# Patient Record
Sex: Male | Born: 1995 | Race: White | Hispanic: No | Marital: Single | Smoking: Never smoker
Health system: Southern US, Community
[De-identification: ages and names within clinical notes are randomized; demographics above are authoritative.]

## PROBLEM LIST (undated history)

## (undated) HISTORY — PX: SKIN GRAFT: SHX250

---

## 2013-05-31 ENCOUNTER — Emergency Department (HOSPITAL_COMMUNITY)
Admission: EM | Admit: 2013-05-31 | Discharge: 2013-05-31 | Disposition: A | Discharge status: Home / Self Care | Payer: Self-pay | Attending: Emergency Medicine | Admitting: Emergency Medicine

## 2013-05-31 ENCOUNTER — Encounter (HOSPITAL_COMMUNITY): Payer: Self-pay

## 2013-05-31 DIAGNOSIS — K649 Unspecified hemorrhoids: Secondary | ICD-10-CM

## 2013-05-31 DIAGNOSIS — K5900 Constipation, unspecified: Secondary | ICD-10-CM | POA: Insufficient documentation

## 2013-05-31 DIAGNOSIS — K648 Other hemorrhoids: Secondary | ICD-10-CM | POA: Insufficient documentation

## 2013-05-31 LAB — OCCULT BLOOD, POC DEVICE: Fecal Occult Bld: POSITIVE — AB

## 2013-05-31 MED ORDER — DOCUSATE SODIUM 100 MG PO CAPS: 100.0000 mg | ORAL_CAPSULE | Freq: Three times a day (TID) | ORAL | Status: DC | PRN

## 2013-05-31 MED ORDER — HYDROCORTISONE 2.5 % RE CREA: TOPICAL_CREAM | RECTAL | Status: DC

## 2013-05-31 NOTE — ED Notes (Signed)
Pt c/o rectal pain for past few days.  Denies any bleeding or injury.  Reports feels like swelling.  Denies history of hemorrhoids.

## 2013-05-31 NOTE — ED Provider Notes (Signed)
History     CSN: [id number]  Arrival date & time 05/31/13  0900   First MD Initiated Contact with Patient 05/31/13 [phone]      Chief Complaint  Patient presents with  . Rectal Pain    (Consider location/radiation/quality/duration/timing/severity/associated sxs/prior treatment) HPI Comments: Jonathan Weaver is a 17 y.o. male who presents to the Emergency Department complaining of pain and swelling ot his rectum for several days..  He states that he has hard, dry stools and has been straining recently to have a bowel movement.  He denies abdominal pain, bloating, fever, or vomiting.  He also denies back pain or blood in his stool.  He has not tried any therapies at home prior to ED arrival.     The history is provided by the patient.    History reviewed. No pertinent past medical history.  History reviewed. No pertinent past surgical history.  No family history on file.  History  Substance Use Topics  . Smoking status: Never Smoker   . Smokeless tobacco: Not on file  . Alcohol Use: No      Review of Systems  Constitutional: Negative for fever, chills, activity change and appetite change.  Respiratory: Negative for chest tightness and shortness of breath.   Cardiovascular: Negative for chest pain.  Gastrointestinal: Positive for constipation and rectal pain. Negative for nausea, vomiting, abdominal pain, diarrhea, blood in stool, abdominal distention and anal bleeding.  Genitourinary: Negative for dysuria, hematuria, flank pain, decreased urine volume, discharge, penile swelling, scrotal swelling, difficulty urinating, penile pain and testicular pain.  Musculoskeletal: Negative for back pain and arthralgias.  Neurological: Negative for dizziness, syncope, weakness, light-headedness and numbness.  Psychiatric/Behavioral: Negative for confusion.  All other systems reviewed and are negative.    Allergies  Review of patient's allergies indicates no known allergies.  Home  Medications   Current Outpatient Rx  Name  Route  Sig  Dispense  Refill  . aspirin 325 MG tablet   Oral   Take 325 mg by mouth every 6 (six) hours as needed for pain.           BP 130/79  Pulse 88  Temp(Src) 99.1 F (37.3 C) (Oral)  Resp 18  Ht 5\' 10"  (1.778 m)  Wt 183 lb (83.008 kg)  BMI 26.26 kg/m2  SpO2 98%  Physical Exam  Nursing note and vitals reviewed. Constitutional: He is oriented to person, place, and time. He appears well-developed and well-nourished. No distress.  HENT:  Head: Normocephalic and atraumatic.  Mouth/Throat: Oropharynx is clear and moist.  Cardiovascular: Normal rate, regular rhythm, normal heart sounds and intact distal pulses.   No murmur heard. Pulmonary/Chest: Effort normal and breath sounds normal. No respiratory distress.  Abdominal: Soft. He exhibits no distension and no mass. There is no tenderness. There is no rebound and no guarding.  Genitourinary: Prostate normal. Rectal exam shows internal hemorrhoid and tenderness. Rectal exam shows no external hemorrhoid, no fissure and anal tone normal. Guaiac positive stool.  Brown heme positive stool.  Palpable internal hemorrhoid just inside the anus.  No other rectal masses.  Prostate is NT.    Musculoskeletal: Normal range of motion. He exhibits no edema and no tenderness.  Neurological: He is alert and oriented to person, place, and time. He exhibits normal muscle tone. Coordination normal.  Skin: Skin is warm and dry.    ED Course  Procedures (including critical care time)  Labs Reviewed  OCCULT BLOOD, POC DEVICE - Abnormal; Notable for the following:  Fecal Occult Bld POSITIVE (*)    All other components within normal limits        MDM   Pt is well appearing, lying on the stretcher, NAD, texting on his phone.  Orthostatic vitals are wnml.  Small palpable internal hemorrhoid w/o visible thrombus or frank bleeding.  Abdomen is soft, NT on exam.  No perirectal abscess seen.    Pt  agrees to sitz baths, anusol HC cream and stool softener.  Advised him to drink water and increase fiber.  He appear stable for d/c and agrees to return here if sx's worsen.         Shann Merrick L. Trisha Mangle, PA-C 06/01/13 2148

## 2013-06-02 NOTE — ED Provider Notes (Signed)
Medical screening examination/treatment/procedure(s) were performed by non-physician practitioner and as supervising physician I was immediately available for consultation/collaboration.   Caira Poche, MD 06/02/13 0715 

## 2013-06-05 ENCOUNTER — Emergency Department (HOSPITAL_COMMUNITY)
Admission: EM | Admit: 2013-06-05 | Discharge: 2013-06-05 | Disposition: A | Discharge status: Home / Self Care | Payer: Self-pay | Attending: Emergency Medicine | Admitting: Emergency Medicine

## 2013-06-05 ENCOUNTER — Emergency Department (HOSPITAL_COMMUNITY): Payer: Self-pay

## 2013-06-05 ENCOUNTER — Encounter (HOSPITAL_COMMUNITY): Payer: Self-pay

## 2013-06-05 DIAGNOSIS — K610 Anal abscess: Secondary | ICD-10-CM

## 2013-06-05 DIAGNOSIS — R509 Fever, unspecified: Secondary | ICD-10-CM | POA: Insufficient documentation

## 2013-06-05 DIAGNOSIS — R300 Dysuria: Secondary | ICD-10-CM | POA: Insufficient documentation

## 2013-06-05 DIAGNOSIS — M549 Dorsalgia, unspecified: Secondary | ICD-10-CM | POA: Insufficient documentation

## 2013-06-05 DIAGNOSIS — K612 Anorectal abscess: Secondary | ICD-10-CM | POA: Insufficient documentation

## 2013-06-05 DIAGNOSIS — Z79899 Other long term (current) drug therapy: Secondary | ICD-10-CM | POA: Insufficient documentation

## 2013-06-05 LAB — CBC WITH DIFFERENTIAL/PLATELET
Basophils Absolute: 0 10*3/uL (ref 0.0–0.1)
Basophils Relative: 0 % (ref 0–1)
Eosinophils Relative: 1 % (ref 0–5)
HCT: 41.8 % (ref 39.0–52.0)
MCHC: 33.5 g/dL (ref 30.0–36.0)
MCV: 86.7 fL (ref 78.0–100.0)
Monocytes Absolute: 1.3 10*3/uL — ABNORMAL HIGH (ref 0.1–1.0)
Neutro Abs: 9.7 10*3/uL — ABNORMAL HIGH (ref 1.7–7.7)
RDW: 13.1 % (ref 11.5–15.5)

## 2013-06-05 LAB — BASIC METABOLIC PANEL
BUN: 22 mg/dL (ref 6–23)
Creatinine, Ser: 1.01 mg/dL (ref 0.50–1.35)
GFR calc Af Amer: >90 mL/min (ref >90)
GFR calc non Af Amer: >90 mL/min (ref >90)

## 2013-06-05 MED ORDER — SODIUM CHLORIDE 0.9 % IV SOLN
Freq: Once | INTRAVENOUS | Status: AC
  Administered 2013-06-05: 13:00:00 via INTRAVENOUS

## 2013-06-05 MED ORDER — METRONIDAZOLE 500 MG PO TABS: 500.0000 mg | ORAL_TABLET | Freq: Two times a day (BID) | ORAL | Status: DC

## 2013-06-05 MED ORDER — IOHEXOL 300 MG/ML  SOLN
50.0000 mL | Freq: Once | INTRAMUSCULAR | Status: AC | PRN
  Administered 2013-06-05: 50 mL via ORAL

## 2013-06-05 MED ORDER — CIPROFLOXACIN HCL 500 MG PO TABS: 500.0000 mg | ORAL_TABLET | Freq: Two times a day (BID) | ORAL | Status: DC

## 2013-06-05 MED ORDER — SODIUM CHLORIDE 0.9 % IV BOLUS (SEPSIS)
1000.0000 mL | Freq: Once | INTRAVENOUS | Status: AC
  Administered 2013-06-05: 1000 mL via INTRAVENOUS

## 2013-06-05 MED ORDER — ONDANSETRON HCL 4 MG/2ML IJ SOLN
4.0000 mg | Freq: Once | INTRAMUSCULAR | Status: AC
  Administered 2013-06-05: 4 mg via INTRAVENOUS
  Filled 2013-06-05: qty 2

## 2013-06-05 MED ORDER — HYDROMORPHONE HCL PF 1 MG/ML IJ SOLN
1.0000 mg | Freq: Once | INTRAMUSCULAR | Status: AC
  Administered 2013-06-05: 1 mg via INTRAVENOUS
  Filled 2013-06-05: qty 1

## 2013-06-05 MED ORDER — SODIUM CHLORIDE 0.9 % IV SOLN
3.0000 g | Freq: Once | INTRAVENOUS | Status: AC
  Administered 2013-06-05: 3 g via INTRAVENOUS
  Filled 2013-06-05: qty 3

## 2013-06-05 MED ORDER — IOHEXOL 300 MG/ML  SOLN
100.0000 mL | Freq: Once | INTRAMUSCULAR | Status: AC | PRN
  Administered 2013-06-05: 100 mL via INTRAVENOUS

## 2013-06-05 MED ORDER — ONDANSETRON HCL 8 MG PO TABS: 8.0000 mg | ORAL_TABLET | Freq: Three times a day (TID) | ORAL | Status: DC | PRN

## 2013-06-05 MED ORDER — OXYCODONE-ACETAMINOPHEN 5-325 MG PO TABS: 1.0000 | ORAL_TABLET | ORAL | Status: DC | PRN

## 2013-06-05 NOTE — ED Notes (Signed)
States that he was diagnosed with hemorrhoids about 4 days ago, states that the pain has gotten worse and is seeing bloody discharge from his rectum.

## 2013-06-05 NOTE — ED Notes (Signed)
Pt reports was diagnosed with hemorrhoids 4 days ago.  Has been using otc cream without relief.

## 2013-06-05 NOTE — ED Provider Notes (Signed)
History  This chart was scribed for Joya Gaskins, MD by Greggory Stallion, ED Scribe. This patient was seen in room [facility]17/[facility] and the patient's care was started at 12:38 PM.   CSN: [id number]  Arrival date & time 06/05/13  1203    Chief Complaint  Patient presents with  . Hemorrhoids    The history is provided by the patient. No language interpreter was used.    HPI Comments: Jonathan Weaver is a 17 y.o. male who presents to the Emergency Department complaining of hemorrhoids that started 6 days ago with associated fevers and back pain. Pt states he can pass bowel movements but they are with pain. He states he has dysuria if he forces it out. Pt denies neck pain, sore throat, visual disturbance, CP, cough, SOB, abdominal pain, nausea, emesis, diarrhea, HA, weakness, numbness and rash as associated symptoms.   Nothing improves his symptoms His course is worsening  PMH - none    History  Substance Use Topics  . Smoking status: Never Smoker   . Smokeless tobacco: Not on file  . Alcohol Use: No      Review of Systems  Constitutional: Positive for fever.  HENT: Negative for neck pain.   Eyes: Negative for visual disturbance.  Respiratory: Negative for cough and shortness of breath.   Cardiovascular: Negative for chest pain.  Gastrointestinal: Negative for abdominal pain.  Genitourinary: Positive for dysuria.  Musculoskeletal: Positive for back pain.  All other systems reviewed and are negative.    Allergies  Review of patient's allergies indicates no known allergies.  Home Medications   Current Outpatient Rx  Name  Route  Sig  Dispense  Refill  . aspirin 325 MG tablet   Oral   Take 325 mg by mouth every 6 (six) hours as needed for pain.         Marland Kitchen docusate sodium (COLACE) 100 MG capsule   Oral   Take 1 capsule (100 mg total) by mouth 3 (three) times daily as needed for constipation.   60 capsule   0   . hydrocortisone (ANUSOL-HC) 2.5 % rectal cream     Apply rectally 2 times daily   30 g   0     BP 121/78  Pulse 101  Temp(Src) 100.2 F (37.9 C) (Oral)  Resp 16  Ht 5\' 9"  (1.753 m)  Wt 183 lb (83.008 kg)  BMI 27.01 kg/m2  SpO2 98% BP 129/64  Pulse 69  Temp(Src) 100.2 F (37.9 C) (Oral)  Resp 18  Ht 5\' 9"  (1.753 m)  Wt 183 lb (83.008 kg)  BMI 27.01 kg/m2  SpO2 99%   Physical Exam  CONSTITUTIONAL: Well developed/well nourished, uncomfortable appearing HEAD: Normocephalic/atraumatic EYES: EOMI/PERRL ENMT: Mucous membranes moist NECK: supple no meningeal signs SPINE:entire spine nontender CV: S1/S2 noted, no murmurs/rubs/gallops noted LUNGS: Lungs are clear to auscultation bilaterally, no apparent distress ABDOMEN: soft, nontender, no rebound or guarding Rectal - pus noted from rectum.  No external abscess noted.  He is tender on digital exam.  He is diffusely tender in perirectal region and has firmness in the perineum GU:no cva tenderness NEURO: Pt is awake/alert, moves all extremitiesx4 EXTREMITIES: pulses normal, full ROM SKIN: warm, color normal PSYCH: no abnormalities of mood noted  ED Course  Procedures   DIAGNOSTIC STUDIES: Oxygen Saturation is 98% on RA, normal by my interpretation.    COORDINATION OF CARE: 12:43 PM-Discussed treatment plan with pt at bedside and pt agreed to plan.  Concern for  perirectal abcess, CT imaging ordered  3:10 PM Per CT, pt has perianal abscess I spoke to mark jenkins with surgery I informed him that patient is already draining from abscess and he is nontoxic and not septic Pt can be discharged on oral cipro/flagyll and f/u with outpatient clinic dr ziegler on Tuesday Pt is agreeable with this plan and is comfortable with plan He will return if pain worsens, any new abdominal pain or unable to tolerate the meds   MDM  Nursing notes including past medical history and social history reviewed and considered in documentation Labs/vital reviewed and considered Previous  records reviewed and considered - recent ED visit reviewed       I personally performed the services described in this documentation, which was scribed in my presence. The recorded information has been reviewed and is accurate.     Joya Gaskins, MD 06/05/13 [phone]

## 2013-10-13 ENCOUNTER — Encounter (HOSPITAL_COMMUNITY): Payer: Self-pay | Admitting: Emergency Medicine

## 2013-10-13 ENCOUNTER — Emergency Department (HOSPITAL_COMMUNITY)
Admission: EM | Admit: 2013-10-13 | Discharge: 2013-10-13 | Disposition: A | Discharge status: Home / Self Care | Payer: Self-pay | Attending: Emergency Medicine | Admitting: Emergency Medicine

## 2013-10-13 DIAGNOSIS — J029 Acute pharyngitis, unspecified: Secondary | ICD-10-CM

## 2013-10-13 DIAGNOSIS — R5381 Other malaise: Secondary | ICD-10-CM | POA: Insufficient documentation

## 2013-10-13 MED ORDER — AMOXICILLIN 500 MG PO CAPS: 500.0000 mg | ORAL_CAPSULE | Freq: Three times a day (TID) | ORAL | Status: AC

## 2013-10-13 MED ORDER — AMOXICILLIN 250 MG PO CAPS
500.0000 mg | ORAL_CAPSULE | Freq: Once | ORAL | Status: AC
  Administered 2013-10-13: 500 mg via ORAL

## 2013-10-13 MED ORDER — ONDANSETRON 8 MG PO TBDP
ORAL_TABLET | ORAL | Status: AC
  Filled 2013-10-13: qty 1

## 2013-10-13 MED ORDER — IBUPROFEN 800 MG PO TABS
800.0000 mg | ORAL_TABLET | Freq: Once | ORAL | Status: AC
  Administered 2013-10-13: 800 mg via ORAL
  Filled 2013-10-13: qty 1

## 2013-10-13 MED ORDER — MAGIC MOUTHWASH W/LIDOCAINE: 10.0000 mL | Freq: Four times a day (QID) | ORAL | Status: DC | PRN

## 2013-10-13 MED ORDER — ONDANSETRON 8 MG PO TBDP
8.0000 mg | ORAL_TABLET | Freq: Once | ORAL | Status: AC
  Administered 2013-10-13: 8 mg via ORAL

## 2013-10-13 MED ORDER — PROMETHAZINE HCL 25 MG PO TABS: 25.0000 mg | ORAL_TABLET | Freq: Four times a day (QID) | ORAL | Status: DC | PRN

## 2013-10-13 MED ORDER — ACETAMINOPHEN 500 MG PO TABS
ORAL_TABLET | ORAL | Status: AC
  Administered 2013-10-13: 1000 mg
  Filled 2013-10-13: qty 2

## 2013-10-13 MED ORDER — AMOXICILLIN 250 MG PO CAPS
ORAL_CAPSULE | ORAL | Status: AC
  Filled 2013-10-13: qty 2

## 2013-10-13 NOTE — ED Notes (Signed)
Pt has been able to drink 2 cups of water and is drinking a sprite.

## 2013-10-13 NOTE — ED Provider Notes (Signed)
Medical screening examination/treatment/procedure(s) were performed by non-physician practitioner and as supervising physician I was immediately available for consultation/collaboration.  EKG Interpretation   None        Raeford Razor, MD 10/13/13 2357

## 2013-10-13 NOTE — ED Notes (Addendum)
Headache, fever, sore throat,  Vomited this am.  Throat red with exudate

## 2013-10-13 NOTE — ED Provider Notes (Signed)
CSN: [id number]     Arrival date & time 10/13/13  1515 History   First MD Initiated Contact with Patient 10/13/13 1606     Chief Complaint  Patient presents with  . Sore Throat   (Consider location/radiation/quality/duration/timing/severity/associated sxs/prior Treatment) HPI Comments: Jonathan Weaver is a 17 y.o. Male presenting with a 2 day history of fever with intermittent chills,  sore throat with swollen tonsils, generally body aches, headache and fatigue.  He has also had 2 episodes of emesis, last occuring yesterday, but has been able to tolerate some PO fluids. Symptoms due to not include shortness of breath, chest pain, abdominal pain or diarrhea.  The patient has taken aspirin yesterday for fever reduction with no significant improvement in symptoms.        The history is provided by the patient.    History reviewed. No pertinent past medical history. History reviewed. No pertinent past surgical history. History reviewed. No pertinent family history. History  Substance Use Topics  . Smoking status: Never Smoker   . Smokeless tobacco: Not on file  . Alcohol Use: No    Review of Systems  Constitutional: Positive for fever, chills and fatigue.  HENT: Positive for sore throat. Negative for congestion, ear pain, facial swelling, mouth sores, rhinorrhea, sinus pressure, trouble swallowing and voice change.   Eyes: Negative for discharge.  Respiratory: Negative for cough, shortness of breath, wheezing and stridor.   Cardiovascular: Negative for chest pain.  Gastrointestinal: Positive for nausea and vomiting. Negative for abdominal pain.  Genitourinary: Negative.     Allergies  Review of patient's allergies indicates no known allergies.  Home Medications   Current Outpatient Rx  Name  Route  Sig  Dispense  Refill  . ibuprofen (ADVIL,MOTRIN) 800 MG tablet   Oral   Take 800 mg by mouth once.         . Alum & Mag Hydroxide-Simeth (MAGIC MOUTHWASH W/LIDOCAINE) SOLN  Oral   Take 10 mLs by mouth 4 (four) times daily as needed (throat pain).   120 mL   0     Dispense equal parts   . amoxicillin (AMOXIL) 500 MG capsule   Oral   Take 1 capsule (500 mg total) by mouth 3 (three) times daily.   30 capsule   0   . promethazine (PHENERGAN) 25 MG tablet   Oral   Take 1 tablet (25 mg total) by mouth every 6 (six) hours as needed for nausea.   15 tablet   0    BP 135/72  Pulse 94  Temp(Src) 99.8 F (37.7 C) (Oral)  Resp 14  Ht 5\' 9"  (1.753 m)  Wt 188 lb 7 oz (85.475 kg)  BMI 27.81 kg/m2  SpO2 99% Physical Exam  Constitutional: He is oriented to person, place, and time. He appears well-developed and well-nourished.  HENT:  Head: Normocephalic and atraumatic.  Right Ear: Tympanic membrane and ear canal normal.  Left Ear: Tympanic membrane and ear canal normal.  Nose: No mucosal edema or rhinorrhea.  Mouth/Throat: Uvula is midline and mucous membranes are normal. No trismus in the jaw. No uvula swelling. Oropharyngeal exudate and posterior oropharyngeal erythema present. No posterior oropharyngeal edema or tonsillar abscesses.  Eyes: Conjunctivae are normal.  Cardiovascular: Normal rate, normal heart sounds and normal pulses.   Pulmonary/Chest: Effort normal. No stridor. No respiratory distress. He has no decreased breath sounds. He has no wheezes. He has no rhonchi. He has no rales.  Abdominal: Soft. There is no tenderness.  Musculoskeletal: Normal range of motion.  Lymphadenopathy:       Head (right side): Tonsillar adenopathy present.       Head (left side): Tonsillar adenopathy present.    He has cervical adenopathy.  Neurological: He is alert and oriented to person, place, and time.  Skin: Skin is warm and dry. No rash noted.  Psychiatric: He has a normal mood and affect.    ED Course  Procedures (including critical care time) Labs Review Labs Reviewed - No data to display Imaging Review No results found.  EKG Interpretation    None       MDM   1. Pharyngitis, acute    Pt treated for strep throat with amoxil,  Classic sx without other uri sx.  Prescribed amoxil, first dose given here.  He was also prescribed magic mouthwash,  Phenergan.  Encouraged rest,  Increased fluid intake,  Recheck here for any worsened sx.  His fever responded while here with dose of tylenol and ibuprofen.  He tolerated PO intake prior to dc home,  Reporting feels better now that fever is improved.  The patient appears reasonably screened and/or stabilized for discharge and I doubt any other medical condition or other [facility] requiring further screening, evaluation, or treatment in the ED at this time prior to discharge.     Burgess Amor, PA-C 10/13/13 1725

## 2015-05-15 ENCOUNTER — Emergency Department (HOSPITAL_COMMUNITY)
Admission: EM | Admit: 2015-05-15 | Discharge: 2015-05-15 | Disposition: A | Discharge status: Home / Self Care | Payer: Self-pay | Attending: Emergency Medicine | Admitting: Emergency Medicine

## 2015-05-15 ENCOUNTER — Encounter (HOSPITAL_COMMUNITY): Payer: Self-pay | Admitting: Emergency Medicine

## 2015-05-15 DIAGNOSIS — R110 Nausea: Secondary | ICD-10-CM | POA: Insufficient documentation

## 2015-05-15 DIAGNOSIS — J039 Acute tonsillitis, unspecified: Secondary | ICD-10-CM | POA: Insufficient documentation

## 2015-05-15 LAB — RAPID STREP SCREEN (MED CTR MEBANE ONLY): STREPTOCOCCUS, GROUP A SCREEN (DIRECT): NEGATIVE

## 2015-05-15 MED ORDER — MAGIC MOUTHWASH W/LIDOCAINE: 5.0000 mL | Freq: Three times a day (TID) | ORAL | Status: DC | PRN

## 2015-05-15 MED ORDER — AMOXICILLIN 500 MG PO CAPS: 500.0000 mg | ORAL_CAPSULE | Freq: Three times a day (TID) | ORAL | Status: DC

## 2015-05-15 MED ORDER — ONDANSETRON 8 MG PO TBDP
8.0000 mg | ORAL_TABLET | Freq: Once | ORAL | Status: AC
  Administered 2015-05-15: 8 mg via ORAL
  Filled 2015-05-15: qty 1

## 2015-05-15 MED ORDER — IBUPROFEN 800 MG PO TABS: 800.0000 mg | ORAL_TABLET | Freq: Three times a day (TID) | ORAL | Status: DC

## 2015-05-15 MED ORDER — IBUPROFEN 800 MG PO TABS
800.0000 mg | ORAL_TABLET | Freq: Once | ORAL | Status: AC
  Administered 2015-05-15: 800 mg via ORAL
  Filled 2015-05-15: qty 1

## 2015-05-15 NOTE — ED Provider Notes (Signed)
CSN: [id number]     Arrival date & time 05/15/15  1537 History  This chart was scribed for non-physician practitioner, Pauline Ausammy Kyndall Amero, PA-C working with Gilda Creasehristopher J Pollina, MD found by Gwenyth Oberatherine Macek, ED scribe. This patient was seen in room APFT21/APFT21 and the patient's care was started at 4:30 PM   Chief Complaint  Patient presents with  . Sore Throat  . Nausea   The history is provided by the patient. No language interpreter was used.    HPI Comments: Jonathan Weaver is a 19 y.o. male who presents to the Emergency Department complaining of constant, moderate sore throat, left side worse than right, that started 2 days ago. Pt states fever, nausea,  loose stools, and cough as associated symptoms. He has not tried any treatment PTA. Pt has been unable to tolerate food intake due to level of throat pain. He denies ear pain, neck pain or stiffness as an associated symptom.  History reviewed. No pertinent past medical history. History reviewed. No pertinent past surgical history. History reviewed. No pertinent family history. History  Substance Use Topics  . Smoking status: Never Smoker   . Smokeless tobacco: Not on file  . Alcohol Use: No    Review of Systems  Constitutional: Positive for fever and appetite change. Negative for chills.  HENT: Positive for sore throat and trouble swallowing. Negative for ear pain.   Respiratory: Positive for cough. Negative for chest tightness.   Gastrointestinal: Positive for nausea. Negative for vomiting, abdominal pain, diarrhea and constipation.  Genitourinary: Negative for dysuria.  Musculoskeletal: Positive for myalgias. Negative for arthralgias.  Skin: Negative for rash.  All other systems reviewed and are negative.     Allergies  Review of patient's allergies indicates no known allergies.  Home Medications   Prior to Admission medications   Medication Sig Start Date End Date Taking? Authorizing Provider  Alum & Mag Hydroxide-Simeth  (MAGIC MOUTHWASH W/LIDOCAINE) SOLN Take 10 mLs by mouth 4 (four) times daily as needed (throat pain). 10/13/13   Burgess AmorJulie Idol, PA-C  ibuprofen (ADVIL,MOTRIN) 800 MG tablet Take 800 mg by mouth once.    Historical Provider, MD  promethazine (PHENERGAN) 25 MG tablet Take 1 tablet (25 mg total) by mouth every 6 (six) hours as needed for nausea. 10/13/13   Burgess AmorJulie Idol, PA-C   BP 136/77 mmHg  Pulse 108  Temp(Src) 100 F (37.8 C) (Oral)  Resp 20  Ht 5\' 11"  (1.803 m)  Wt 213 lb 7 oz (96.815 kg)  BMI 29.78 kg/m2  SpO2 99% Physical Exam  Constitutional: He is oriented to person, place, and time. He appears well-developed and well-nourished. No distress.  HENT:  Head: Normocephalic and atraumatic.  Erythema and edema of bilateral tonsils, exudate present bilaterally; uvula midline.  No peritonsillar abscess  Eyes: Conjunctivae and EOM are normal.  Neck: Normal range of motion. Neck supple. No tracheal deviation present.  Cardiovascular: Normal rate, regular rhythm and normal heart sounds.   No murmur heard. Pulmonary/Chest: Effort normal. No respiratory distress.  Abdominal: Soft. He exhibits no distension. There is no tenderness. There is no rebound and no guarding.  Musculoskeletal: Normal range of motion.  Lymphadenopathy:    He has cervical adenopathy (Left-sided superficial cervical lymphadenopathy ).  Neurological: He is alert and oriented to person, place, and time. Coordination normal.  Skin: Skin is warm and dry.  Psychiatric: He has a normal mood and affect. His behavior is normal.  Nursing note and vitals reviewed.   ED Course  Procedures  DIAGNOSTIC STUDIES: Oxygen Saturation is 99% on RA, normal by my interpretation.    COORDINATION OF CARE: 4:35 PM Discussed treatment plan with pt which includes strep test. Pt agreed to plan.   Labs Review Labs Reviewed  RAPID STREP SCREEN  CULTURE, GROUP A STREP    Imaging Review No results found.   EKG Interpretation None       MDM  Pt texting during my assessment and had to be asked to put away phone during exam.  Final diagnoses:  Tonsillitis   Pt is non-toxic.  Airway patent.  Agrees to close PMD f/u or to return here if needed.  No concerning sx's for tonsillar abscess  I personally performed the services described in this documentation, which was scribed in my presence. The recorded information has been reviewed and is accurate.'   Pauline Aus, PA-C 05/17/15 1347  Gilda Crease, MD 05/18/15 [phone]

## 2015-05-15 NOTE — Discharge Instructions (Signed)
Tonsillitis °Tonsillitis is an infection of the throat. This infection causes the tonsils to become red, tender, and puffy (swollen). Tonsils are groups of tissue at the back of your throat. If bacteria caused your infection, antibiotic medicine will be given to you. Sometimes symptoms of tonsillitis can be relieved with the use of steroid medicine. If your tonsillitis is severe and happens often, you may need to get your tonsils removed (tonsillectomy). °HOME CARE  °· Rest and sleep often. °· Drink enough fluids to keep your pee (urine) clear or pale yellow. °· While your throat is sore, eat soft or liquid foods like: °¨ Soup. °¨ Ice cream. °¨ Instant breakfast drinks. °· Eat frozen ice pops. °· Gargle with a warm or cold liquid to help soothe the throat. Gargle with a water and salt mix. Mix 1/4 teaspoon of salt and 1/4 teaspoon of baking soda in 1 cup of water. °· Only take medicines as told by your doctor. °· If you are given medicines (antibiotics), take them as told. Finish them even if you start to feel better. °GET HELP IF: °· You have large, tender lumps in your neck. °· You have a rash. °· You cough up green, yellow-brown, or bloody fluid. °· You cannot swallow liquids or food for 24 hours. °· You notice that only one of your tonsils is swollen. °GET HELP RIGHT AWAY IF:  °· You throw up (vomit). °· You have a very bad headache. °· You have a stiff neck. °· You have chest pain. °· You have trouble breathing or swallowing. °· You have bad throat pain, drooling, or your voice changes. °· You have bad pain not helped by medicine. °· You cannot fully open your mouth. °· You have redness, puffiness, or bad pain in the neck. °· You have a fever. °MAKE SURE YOU:  °· Understand these instructions. °· Will watch your condition. °· Will get help right away if you are not doing well or get worse. °Document Released: 05/27/2008 Document Revised: 12/14/2013 Document Reviewed: 05/28/2013 °[employer]® Patient Information  ©2015 [employer]. This information is not intended to replace advice given to you by your health care provider. Make sure you discuss any questions you have with your health care provider. ° °

## 2015-05-15 NOTE — ED Notes (Signed)
Patient complaining of sore throat, fever, and nausea x 2 days.

## 2015-05-18 LAB — CULTURE, GROUP A STREP

## 2015-09-13 ENCOUNTER — Emergency Department (HOSPITAL_COMMUNITY)
Admission: EM | Admit: 2015-09-13 | Discharge: 2015-09-13 | Disposition: A | Discharge status: Home / Self Care | Payer: Self-pay | Attending: Emergency Medicine | Admitting: Emergency Medicine

## 2015-09-13 ENCOUNTER — Encounter (HOSPITAL_COMMUNITY): Payer: Self-pay | Admitting: Emergency Medicine

## 2015-09-13 DIAGNOSIS — Z791 Long term (current) use of non-steroidal anti-inflammatories (NSAID): Secondary | ICD-10-CM | POA: Insufficient documentation

## 2015-09-13 DIAGNOSIS — Z792 Long term (current) use of antibiotics: Secondary | ICD-10-CM | POA: Insufficient documentation

## 2015-09-13 DIAGNOSIS — R21 Rash and other nonspecific skin eruption: Secondary | ICD-10-CM | POA: Insufficient documentation

## 2015-09-13 MED ORDER — PREDNISONE 10 MG PO TABS: ORAL_TABLET | ORAL | Status: DC

## 2015-09-13 MED ORDER — PREDNISONE 50 MG PO TABS
60.0000 mg | ORAL_TABLET | Freq: Once | ORAL | Status: AC
  Administered 2015-09-13: 60 mg via ORAL
  Filled 2015-09-13 (×2): qty 1

## 2015-09-13 NOTE — Discharge Instructions (Signed)
Contact Dermatitis °Contact dermatitis is a reaction to certain substances that touch the skin. Contact dermatitis can be either irritant contact dermatitis or allergic contact dermatitis. Irritant contact dermatitis does not require previous exposure to the substance for a reaction to occur. Allergic contact dermatitis only occurs if you have been exposed to the substance before. Upon a repeat exposure, your body reacts to the substance.  °CAUSES  °Many substances can cause contact dermatitis. Irritant dermatitis is most commonly caused by repeated exposure to mildly irritating substances, such as: °· Makeup. °· Soaps. °· Detergents. °· Bleaches. °· Acids. °· Metal salts, such as nickel. °Allergic contact dermatitis is most commonly caused by exposure to: °· Poisonous plants. °· Chemicals (deodorants, shampoos). °· Jewelry. °· Latex. °· Neomycin in triple antibiotic cream. °· Preservatives in products, including clothing. °SYMPTOMS  °The area of skin that is exposed may develop: °· Dryness or flaking. °· Redness. °· Cracks. °· Itching. °· Pain or a burning sensation. °· Blisters. °With allergic contact dermatitis, there may also be swelling in areas such as the eyelids, mouth, or genitals.  °DIAGNOSIS  °Your caregiver can usually tell what the problem is by doing a physical exam. In cases where the cause is uncertain and an allergic contact dermatitis is suspected, a patch skin test may be performed to help determine the cause of your dermatitis. °TREATMENT °Treatment includes protecting the skin from further contact with the irritating substance by avoiding that substance if possible. Barrier creams, powders, and gloves may be helpful. Your caregiver may also recommend: °· Steroid creams or ointments applied 2 times daily. For best results, soak the rash area in cool water for 20 minutes. Then apply the medicine. Cover the area with a plastic wrap. You can store the steroid cream in the refrigerator for a "chilly"  effect on your rash. That may decrease itching. Oral steroid medicines may be needed in more severe cases. °· Antibiotics or antibacterial ointments if a skin infection is present. °· Antihistamine lotion or an antihistamine taken by mouth to ease itching. °· Lubricants to keep moisture in your skin. °· Burow's solution to reduce redness and soreness or to dry a weeping rash. Mix one packet or tablet of solution in 2 cups cool water. Dip a clean washcloth in the mixture, wring it out a bit, and put it on the affected area. Leave the cloth in place for 30 minutes. Do this as often as possible throughout the day. °· Taking several cornstarch or baking soda baths daily if the area is too large to cover with a washcloth. °Harsh chemicals, such as alkalis or acids, can cause skin damage that is like a burn. You should flush your skin for 15 to 20 minutes with cold water after such an exposure. You should also seek immediate medical care after exposure. Bandages (dressings), antibiotics, and pain medicine may be needed for severely irritated skin.  °HOME CARE INSTRUCTIONS °· Avoid the substance that caused your reaction. °· Keep the area of skin that is affected away from hot water, soap, sunlight, chemicals, acidic substances, or anything else that would irritate your skin. °· Do not scratch the rash. Scratching may cause the rash to become infected. °· You may take cool baths to help stop the itching. °· Only take over-the-counter or prescription medicines as directed by your caregiver. °· See your caregiver for follow-up care as directed to make sure your skin is healing properly. °SEEK MEDICAL CARE IF:  °· Your condition is not better after 3   days of treatment.  You seem to be getting worse.  You see signs of infection such as swelling, tenderness, redness, soreness, or warmth in the affected area.  You have any problems related to your medicines. Document Released: 12/06/2000 Document Revised: 03/02/2012  Document Reviewed: 05/14/2011 [facility] Patient Information 2015 [place], [place]. This information is not intended to replace advice given to you by your health care provider. Make sure you discuss any questions you have with your health care provider.   Take your next dose of prednisone tomorrow evening.  I also recommend taking  Benadryl 25 mg every 6 hours if needed for itching.  Cool compresses or ice packs can also help with itch.  Topical anti-itch creams are also helpful.

## 2015-09-13 NOTE — ED Provider Notes (Signed)
CSN: [id number]     Arrival date & time 09/13/15  1711 History   First MD Initiated Contact with Patient 09/13/15 1911     Chief Complaint  Patient presents with  . Rash     (Consider location/radiation/quality/duration/timing/severity/associated sxs/prior Treatment) The history is provided by the patient and a friend.   Jonathan Weaver is a 19 y.o. male presenting with a one-week history of rash which is itchy and predominantly left-sided.  He is currently sleeping on sofa in his girlfriends mothers home, states he only sleeps on his left side and feels more itchy and as if he's being bit by something while he is sleeping.  However, the family sit on the sofa during the daytime and deny similar rash or bites. Girlfriend states that it does not worsen when he sleeps with a sheet or blanket thrown over the sofa. He has applied an itch cream which provided temporary relief.  He denies fevers, chills, sore throat.  He denies any other new exposures to foods, body products, soaps.  There are no pets in the home. Denies history of similar rash.     History reviewed. No pertinent past medical history. History reviewed. No pertinent past surgical history. No family history on file. Social History  Substance Use Topics  . Smoking status: Never Smoker   . Smokeless tobacco: None  . Alcohol Use: No    Review of Systems  Constitutional: Negative for fever and chills.  Respiratory: Negative for shortness of breath and wheezing.   Skin: Positive for rash.  Neurological: Negative for numbness.      Allergies  Review of patient's allergies indicates no known allergies.  Home Medications   Prior to Admission medications   Medication Sig Start Date End Date Taking? Authorizing Provider  Alum & Mag Hydroxide-Simeth (MAGIC MOUTHWASH W/LIDOCAINE) SOLN Take 5 mLs by mouth 3 (three) times daily as needed for mouth pain. 05/15/15   Tammy Triplett, PA-C  amoxicillin (AMOXIL) 500 MG capsule Take 1  capsule (500 mg total) by mouth 3 (three) times daily. 05/15/15   Tammy Triplett, PA-C  ibuprofen (ADVIL,MOTRIN) 800 MG tablet Take 1 tablet (800 mg total) by mouth 3 (three) times daily. 05/15/15   Tammy Triplett, PA-C  predniSONE (DELTASONE) 10 MG tablet 6, 5, 4, 3, 2 then 1 tablet by mouth daily for 6 days total. 09/14/15   Burgess Amor, PA-C   BP 131/75 mmHg  Pulse 95  Temp(Src) 98.6 F (37 C) (Oral)  Resp 13  Wt 203 lb (92.08 kg)  SpO2 94% Physical Exam  Constitutional: He appears well-developed and well-nourished. No distress.  HENT:  Head: Normocephalic.  Neck: Neck supple.  Cardiovascular: Normal rate.   Pulmonary/Chest: Effort normal. He has no wheezes.  Musculoskeletal: Normal range of motion. He exhibits no edema.  Skin: Rash noted. Rash is papular.  Papular, erythematous raised rash mostly on left upper lateral arm and shoulder,  Lesser rash on left lower lateral leg.  No abdominal rash.  No red streaking, no drainage, no vesicles or pustules.      ED Course  Procedures (including critical care time) Labs Review Labs Reviewed - No data to display  Imaging Review No results found. I have personally reviewed and evaluated these images and lab results as part of my medical decision-making.   EKG Interpretation None      MDM   Final diagnoses:  Rash and nonspecific skin eruption    Pt with left sided only rash.  Suspect contact  dermatitis, ?chemical on this sofa he is reacting to.  ?scotchgard or other fabric protector.  Purely left sided rash would not be c/w bedbugs or other insect bites, and no other household members with similar rash who are in the environment.  He was placed on a prednisone taper, advised benadryl prn, topical anti itch cream.  Advised to avoid direct contact with the sofa.  Pt asked for skin testing or biopsy - advised cannot perform such testing in the ed but will refer to dermatology - pt can call for f/u appt if sx persist.  Also advised return  here if sx worsen.  The patient appears reasonably screened and/or stabilized for discharge and I doubt any other medical condition or other [facility] requiring further screening, evaluation, or treatment in the ED at this time prior to discharge.     Burgess Amor, PA-C 09/14/15 0136  Linwood Dibbles, MD 09/14/15 [phone]

## 2015-09-13 NOTE — ED Notes (Signed)
Rash on left side, notice it worse at night

## 2016-01-15 ENCOUNTER — Emergency Department (HOSPITAL_COMMUNITY)
Admission: EM | Admit: 2016-01-15 | Discharge: 2016-01-15 | Disposition: A | Discharge status: Home / Self Care | Payer: Self-pay | Attending: Emergency Medicine | Admitting: Emergency Medicine

## 2016-01-15 ENCOUNTER — Emergency Department (HOSPITAL_COMMUNITY): Payer: Self-pay

## 2016-01-15 ENCOUNTER — Encounter (HOSPITAL_COMMUNITY): Payer: Self-pay | Admitting: *Deleted

## 2016-01-15 DIAGNOSIS — Z23 Encounter for immunization: Secondary | ICD-10-CM | POA: Insufficient documentation

## 2016-01-15 DIAGNOSIS — H9311 Tinnitus, right ear: Secondary | ICD-10-CM | POA: Insufficient documentation

## 2016-01-15 DIAGNOSIS — S0993XA Unspecified injury of face, initial encounter: Secondary | ICD-10-CM | POA: Insufficient documentation

## 2016-01-15 DIAGNOSIS — S50812A Abrasion of left forearm, initial encounter: Secondary | ICD-10-CM | POA: Insufficient documentation

## 2016-01-15 DIAGNOSIS — S199XXA Unspecified injury of neck, initial encounter: Secondary | ICD-10-CM | POA: Insufficient documentation

## 2016-01-15 DIAGNOSIS — R519 Headache, unspecified: Secondary | ICD-10-CM

## 2016-01-15 DIAGNOSIS — M79644 Pain in right finger(s): Secondary | ICD-10-CM

## 2016-01-15 DIAGNOSIS — S6991XA Unspecified injury of right wrist, hand and finger(s), initial encounter: Secondary | ICD-10-CM | POA: Insufficient documentation

## 2016-01-15 DIAGNOSIS — R51 Headache: Secondary | ICD-10-CM

## 2016-01-15 DIAGNOSIS — Y9289 Other specified places as the place of occurrence of the external cause: Secondary | ICD-10-CM | POA: Insufficient documentation

## 2016-01-15 DIAGNOSIS — Y9389 Activity, other specified: Secondary | ICD-10-CM | POA: Insufficient documentation

## 2016-01-15 DIAGNOSIS — Y998 Other external cause status: Secondary | ICD-10-CM | POA: Insufficient documentation

## 2016-01-15 MED ORDER — IBUPROFEN 600 MG PO TABS: 600.0000 mg | ORAL_TABLET | Freq: Four times a day (QID) | ORAL | Status: AC | PRN

## 2016-01-15 MED ORDER — ACETAMINOPHEN 325 MG PO TABS
650.0000 mg | ORAL_TABLET | Freq: Once | ORAL | Status: AC
  Administered 2016-01-15: 650 mg via ORAL
  Filled 2016-01-15: qty 2

## 2016-01-15 MED ORDER — TETANUS-DIPHTH-ACELL PERTUSSIS 5-2.5-18.5 LF-MCG/0.5 IM SUSP
0.5000 mL | Freq: Once | INTRAMUSCULAR | Status: AC
  Administered 2016-01-15: 0.5 mL via INTRAMUSCULAR
  Filled 2016-01-15: qty 0.5
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
## 2016-01-15 NOTE — ED Notes (Signed)
[employer] in to talk to patient
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
## 2017-02-11 ENCOUNTER — Emergency Department (HOSPITAL_COMMUNITY)
Admission: EM | Admit: 2017-02-11 | Discharge: 2017-02-12 | Disposition: A | Discharge status: Home / Self Care | Payer: Self-pay | Attending: Emergency Medicine | Admitting: Emergency Medicine

## 2017-02-11 ENCOUNTER — Encounter (HOSPITAL_COMMUNITY): Payer: Self-pay

## 2017-02-11 DIAGNOSIS — K4090 Unilateral inguinal hernia, without obstruction or gangrene, not specified as recurrent: Secondary | ICD-10-CM | POA: Insufficient documentation

## 2017-02-11 DIAGNOSIS — R109 Unspecified abdominal pain: Secondary | ICD-10-CM

## 2017-02-11 DIAGNOSIS — K921 Melena: Secondary | ICD-10-CM | POA: Insufficient documentation

## 2017-02-11 DIAGNOSIS — R197 Diarrhea, unspecified: Secondary | ICD-10-CM

## 2017-02-11 DIAGNOSIS — R1110 Vomiting, unspecified: Secondary | ICD-10-CM

## 2017-02-11 MED ORDER — DIPHENOXYLATE-ATROPINE 2.5-0.025 MG PO TABS: 2.0000 | ORAL_TABLET | Freq: Four times a day (QID) | ORAL | 0 refills | Status: AC | PRN

## 2017-02-11 MED ORDER — PROMETHAZINE HCL 25 MG PO TABS: 25.0000 mg | ORAL_TABLET | Freq: Four times a day (QID) | ORAL | 0 refills | Status: AC | PRN

## 2017-02-11 NOTE — ED Triage Notes (Signed)
Patient complaining of pain in lower left abdomen that he describes as a pinching pain that presents when he is working.  Also states that he has been vomiting, which started 2 days ago.  Patient states that he has not vomited today, but does not have an appetite.  Had diarrhea 2 days ago, no diarrhea today.  Denies fever or body aches.

## 2017-02-12 NOTE — ED Provider Notes (Signed)
[facility]-EM[facility] DEPT Provider Note   CSN: [id number] Arrival date & time: 02/11/17  2114     History   Chief Complaint Chief Complaint  Patient presents with  . Abdominal Pain    HPI Erenest Blankyler Vidrio is a 21 y.o. male.  Patient presents with multiple complaints. Patient reports he has been having pain in his left groin area intermittently when he works for months. He has felt a small amount in the region.  She also concerned about nausea, vomiting and diarrhea. Symptoms began 2 days ago [facility] [employer] [employer]. He reports that his girlfriend had similar symptoms, is now in the hospital because of the symptoms. His vomiting and diarrhea has stopped, but his appetite is poor still. He did note some bleeding in his diarrhea earlier, but this was stopped as well. He has not noted any abdominal pain or fever.      History reviewed. No pertinent past medical history.  There are no active problems to display for this patient.   History reviewed. No pertinent surgical history.     Home Medications    Prior to Admission medications   Medication Sig Start Date End Date Taking? Authorizing Provider  diphenoxylate-atropine (LOMOTIL) 2.5-0.025 MG tablet Take 2 tablets by mouth 4 (four) times daily as needed for diarrhea or loose stools. 02/11/17   Gilda Creasehristopher J Pollina, MD  ibuprofen (ADVIL,MOTRIN) 600 MG tablet Take 1 tablet (600 mg total) by mouth every 6 (six) hours as needed. 01/15/16   Hanna Patel-Mills, PA-C  promethazine (PHENERGAN) 25 MG tablet Take 1 tablet (25 mg total) by mouth every 6 (six) hours as needed for nausea or vomiting. 02/11/17   Gilda Creasehristopher J Pollina, MD    Family History No family history on file.  Social History Social History  Substance Use Topics  . Smoking status: Never Smoker  . Smokeless tobacco: Never Used  . Alcohol use No     Allergies   Patient has no allergy information on record.   Review of Systems Review of Systems  Gastrointestinal:  Positive for abdominal pain, blood in stool, diarrhea, nausea and vomiting.  All other systems reviewed and are negative.    Physical Exam Updated Vital Signs BP 140/64 (BP Location: Left Arm)   Pulse 71   Temp 99.7 F (37.6 C) (Oral)   Resp 18   Ht 5\' 10"  (1.778 m)   Wt 201 lb 12.8 oz (91.5 kg)   SpO2 99%   BMI 28.96 kg/m   Physical Exam  Constitutional: He is oriented to person, place, and time. He appears well-developed and well-nourished. No distress.  HENT:  Head: Normocephalic and atraumatic.  Right Ear: Hearing normal.  Left Ear: Hearing normal.  Nose: Nose normal.  Mouth/Throat: Oropharynx is clear and moist and mucous membranes are normal.  Eyes: Conjunctivae and EOM are normal. Pupils are equal, round, and reactive to light.  Neck: Normal range of motion. Neck supple.  Cardiovascular: Regular rhythm, S1 normal and S2 normal.  Exam reveals no gallop and no friction rub.   No murmur heard. Pulmonary/Chest: Effort normal and breath sounds normal. No respiratory distress. He exhibits no tenderness.  Abdominal: Soft. Normal appearance and bowel sounds are normal. There is no hepatosplenomegaly. There is no tenderness. There is no rebound, no guarding, no tenderness at McBurney's point and negative Murphy's sign. A hernia is present. Hernia confirmed positive in the left inguinal area (small bulge with valsalva).  Musculoskeletal: Normal range of motion.  Neurological: He is alert and  oriented to person, place, and time. He has normal strength. No cranial nerve deficit or sensory deficit. Coordination normal. GCS eye subscore is 4. GCS verbal subscore is 5. GCS motor subscore is 6.  Skin: Skin is warm, dry and intact. No rash noted. No cyanosis.  Psychiatric: He has a normal mood and affect. His speech is normal and behavior is normal. Thought content normal.  Nursing note and vitals reviewed.    ED Treatments / Results  Labs (all labs ordered are listed, but only  abnormal results are displayed) Labs Reviewed - No data to display  EKG  EKG Interpretation None       Radiology No results found.  Procedures Procedures (including critical care time)  Medications Ordered in ED Medications - No data to display   Initial Impression / Assessment and Plan / ED Course  I have reviewed the triage vital signs and the nursing notes.  Pertinent labs & imaging results that were available during my care of the patient were reviewed by me and considered in my medical decision making (see chart for details).     Exam suggestive of very small and early left inguinal hernia, no concern for incarceration. Recommend follow-up with general surgery as needed. Patient has resolving symptoms of vomiting, diarrhea. Abdominal exam is benign. No clinical concern for dehydration. Patient provided symptomatic treatment. He does report some rectal bleeding. This is resolving as well, possibly secondary to hemorrhoid, but will refer to gastroenterology.  Final Clinical Impressions(s) / ED Diagnoses   Final diagnoses:  Abdominal pain, vomiting, and diarrhea  Hematochezia  Unilateral inguinal hernia without obstruction or gangrene, recurrence not specified    New Prescriptions New Prescriptions   DIPHENOXYLATE-ATROPINE (LOMOTIL) 2.5-0.025 MG TABLET    Take 2 tablets by mouth 4 (four) times daily as needed for diarrhea or loose stools.   PROMETHAZINE (PHENERGAN) 25 MG TABLET    Take 1 tablet (25 mg total) by mouth every 6 (six) hours as needed for nausea or vomiting.     Gilda Crease, MD 02/12/17 [phone]

## 2017-04-23 ENCOUNTER — Encounter (HOSPITAL_COMMUNITY): Payer: Self-pay | Admitting: Emergency Medicine

## 2017-04-23 ENCOUNTER — Emergency Department (HOSPITAL_COMMUNITY)
Admission: EM | Admit: 2017-04-23 | Discharge: 2017-04-23 | Disposition: A | Discharge status: Home / Self Care | Payer: Self-pay | Attending: Emergency Medicine | Admitting: Emergency Medicine

## 2017-04-23 DIAGNOSIS — K4090 Unilateral inguinal hernia, without obstruction or gangrene, not specified as recurrent: Secondary | ICD-10-CM | POA: Insufficient documentation

## 2017-04-23 MED ORDER — NAPROXEN 500 MG PO TABS: 500.0000 mg | ORAL_TABLET | Freq: Two times a day (BID) | ORAL | 0 refills | Status: AC

## 2017-04-23 NOTE — Discharge Instructions (Signed)
Take the medications as needed for pain, follow-up with a general surgeon for further evaluation

## 2017-04-23 NOTE — ED Provider Notes (Signed)
[facility] Provider Note   CSN: [id number] Arrival date & time: 04/23/17  1837     History   Chief Complaint Chief Complaint  Patient presents with  . Groin Pain    HPI Jonathan Weaver is a 21 y.o. male.  HPI Patient presents to the emergency room for evaluation of left groin pain that started about 3 weeks ago. Patient notices it with certain activities at work. He feels it especially when he has to strain or exert himself. Pain is sharp and pinching in the left groin. He denies any dysuria. Denies any vomiting or diarrhea. He denies any fevers or chills. History reviewed. No pertinent past medical history.  There are no active problems to display for this patient.   History reviewed. No pertinent surgical history.     Home Medications    Prior to Admission medications   Medication Sig Start Date End Date Taking? Authorizing Provider  diphenoxylate-atropine (LOMOTIL) 2.5-0.025 MG tablet Take 2 tablets by mouth 4 (four) times daily as needed for diarrhea or loose stools. 02/11/17   Gilda Crease, MD  ibuprofen (ADVIL,MOTRIN) 600 MG tablet Take 1 tablet (600 mg total) by mouth every 6 (six) hours as needed. 01/15/16   Hanna Patel-Mills, PA-C  naproxen (NAPROSYN) 500 MG tablet Take 1 tablet (500 mg total) by mouth 2 (two) times daily with a meal. As needed for pain 04/23/17   Linwood Dibbles, MD  promethazine (PHENERGAN) 25 MG tablet Take 1 tablet (25 mg total) by mouth every 6 (six) hours as needed for nausea or vomiting. 02/11/17   Gilda Crease, MD    Family History No family history on file.  Social History Social History  Substance Use Topics  . Smoking status: Never Smoker  . Smokeless tobacco: Never Used  . Alcohol use No     Allergies   Patient has no allergy information on record.   Review of Systems Review of Systems  All other systems reviewed and are negative.    Physical Exam Updated Vital Signs BP 130/84   Pulse 84   Temp 99.1  F (37.3 C)   Resp 18   Ht  (1.778 m)   Wt 94.1 kg   SpO2 99%   BMI 29.77 kg/m   Physical Exam  Constitutional: He appears well-developed and well-nourished. No distress.  HENT:  Head: Normocephalic and atraumatic.  Right Ear: External ear normal.  Left Ear: External ear normal.  Eyes: Conjunctivae are normal. Right eye exhibits no discharge. Left eye exhibits no discharge. No scleral icterus.  Neck: Neck supple. No tracheal deviation present.  Cardiovascular: Normal rate, regular rhythm and normal heart sounds.   Pulmonary/Chest: Effort normal. No stridor. No respiratory distress. He has wheezes. He has rales.  Abdominal: Soft. Bowel sounds are normal. He exhibits no distension and no mass. There is no tenderness. There is no guarding. A hernia is present. Hernia confirmed positive in the left inguinal area.  Genitourinary: Right testis shows no mass and no tenderness. Left testis shows no mass and no tenderness.  Musculoskeletal: He exhibits no edema.  Neurological: He is alert. Cranial nerve deficit: no gross deficits.  Skin: Skin is warm and dry. No rash noted.  Psychiatric: He has a normal mood and affect.  Nursing note and vitals reviewed.    ED Treatments / Results  Procedures Procedures (including critical care time)  Medications Ordered in ED Medications - No data to display   Initial Impression / Assessment and Plan /  ED Course  I have reviewed the triage vital signs and the nursing notes.  Pertinent labs & imaging results that were available during my care of the patient were reviewed by me and considered in my medical decision making (see chart for details).    Patient has a left inguinal hernia on exam. I'm able to palpate the hernia with Valsalva maneuver.  I recommended nonsteroidal pain medications. Discussed follow-up with a general surgeon. Avoid heavy lifting.  Final Clinical Impressions(s) / ED Diagnoses   Final diagnoses:  Inguinal hernia,  left    New Prescriptions New Prescriptions   NAPROXEN (NAPROSYN) 500 MG TABLET    Take 1 tablet (500 mg total) by mouth 2 (two) times daily with a meal. As needed for pain     Linwood Dibbles, MD 04/23/17 2036

## 2017-04-23 NOTE — ED Triage Notes (Signed)
Pt c/o left groin pain x 3 weeks that he describes a pinching sensation.

## 2018-11-01 ENCOUNTER — Other Ambulatory Visit: Payer: Self-pay

## 2018-11-01 ENCOUNTER — Emergency Department (HOSPITAL_COMMUNITY)
Admission: EM | Admit: 2018-11-01 | Discharge: 2018-11-01 | Disposition: A | Discharge status: Home / Self Care | Payer: Self-pay | Attending: Emergency Medicine | Admitting: Emergency Medicine

## 2018-11-01 ENCOUNTER — Emergency Department (HOSPITAL_COMMUNITY): Payer: Self-pay

## 2018-11-01 ENCOUNTER — Encounter (HOSPITAL_COMMUNITY): Payer: Self-pay | Admitting: Emergency Medicine

## 2018-11-01 DIAGNOSIS — L03011 Cellulitis of right finger: Secondary | ICD-10-CM | POA: Insufficient documentation

## 2018-11-01 DIAGNOSIS — Z79899 Other long term (current) drug therapy: Secondary | ICD-10-CM | POA: Insufficient documentation

## 2018-11-01 MED ORDER — SULFAMETHOXAZOLE-TRIMETHOPRIM 800-160 MG PO TABS: 1.0000 | ORAL_TABLET | Freq: Two times a day (BID) | ORAL | 0 refills | Status: AC

## 2018-11-01 MED ORDER — LIDOCAINE HCL (PF) 2 % IJ SOLN
INTRAMUSCULAR | Status: AC
  Administered 2018-11-01: 5 mL
  Filled 2018-11-01: qty 10
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
## 2018-11-01 NOTE — Discharge Instructions (Addendum)
Soak your finger in warm water 2-3 times a day until the area heals.  You may keep it bandaged as needed.  Return if needed.
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
